# Patient Record
Sex: Female | Born: 1995 | Race: White | Hispanic: No | Marital: Single | Smoking: Former smoker
Health system: Southern US, Community
[De-identification: ages and names within clinical notes are randomized; demographics above are authoritative.]

## PROBLEM LIST (undated history)

## (undated) HISTORY — PX: NOSE SURGERY: SHX723

---

## 2016-01-11 ENCOUNTER — Emergency Department (HOSPITAL_BASED_OUTPATIENT_CLINIC_OR_DEPARTMENT_OTHER): Payer: Medicaid Other

## 2016-01-11 ENCOUNTER — Encounter (HOSPITAL_BASED_OUTPATIENT_CLINIC_OR_DEPARTMENT_OTHER): Payer: Self-pay | Admitting: *Deleted

## 2016-01-11 ENCOUNTER — Emergency Department (HOSPITAL_BASED_OUTPATIENT_CLINIC_OR_DEPARTMENT_OTHER)
Admission: EM | Admit: 2016-01-11 | Discharge: 2016-01-11 | Disposition: A | Discharge status: Home / Self Care | Payer: Medicaid Other | Attending: Physician Assistant | Admitting: Physician Assistant

## 2016-01-11 DIAGNOSIS — I951 Orthostatic hypotension: Secondary | ICD-10-CM | POA: Insufficient documentation

## 2016-01-11 DIAGNOSIS — R42 Dizziness and giddiness: Secondary | ICD-10-CM | POA: Diagnosis not present

## 2016-01-11 DIAGNOSIS — N390 Urinary tract infection, site not specified: Secondary | ICD-10-CM

## 2016-01-11 DIAGNOSIS — Z3A11 11 weeks gestation of pregnancy: Secondary | ICD-10-CM | POA: Insufficient documentation

## 2016-01-11 DIAGNOSIS — Z3490 Encounter for supervision of normal pregnancy, unspecified, unspecified trimester: Secondary | ICD-10-CM

## 2016-01-11 DIAGNOSIS — O99411 Diseases of the circulatory system complicating pregnancy, first trimester: Secondary | ICD-10-CM | POA: Insufficient documentation

## 2016-01-11 DIAGNOSIS — R55 Syncope and collapse: Secondary | ICD-10-CM

## 2016-01-11 DIAGNOSIS — O2341 Unspecified infection of urinary tract in pregnancy, first trimester: Secondary | ICD-10-CM | POA: Insufficient documentation

## 2016-01-11 DIAGNOSIS — O9989 Other specified diseases and conditions complicating pregnancy, childbirth and the puerperium: Secondary | ICD-10-CM | POA: Diagnosis present

## 2016-01-11 LAB — COMPREHENSIVE METABOLIC PANEL
ALBUMIN: 3.4 g/dL — AB (ref 3.5–5.0)
ALK PHOS: 55 U/L (ref 38–126)
ALT: 13 U/L — AB (ref 14–54)
AST: 13 U/L — AB (ref 15–41)
Anion gap: 6 (ref 5–15)
BILIRUBIN TOTAL: 0.3 mg/dL (ref 0.3–1.2)
BUN: 8 mg/dL (ref 6–20)
CALCIUM: 8.9 mg/dL (ref 8.9–10.3)
CO2: 24 mmol/L (ref 22–32)
Chloride: 105 mmol/L (ref 101–111)
Creatinine, Ser: 0.43 mg/dL — ABNORMAL LOW (ref 0.44–1.00)
GFR calc Af Amer: >60 mL/min (ref >60)
GFR calc non Af Amer: >60 mL/min (ref >60)
GLUCOSE: 71 mg/dL (ref 65–99)
Potassium: 3.7 mmol/L (ref 3.5–5.1)
Sodium: 135 mmol/L (ref 135–145)
TOTAL PROTEIN: 6.6 g/dL (ref 6.5–8.1)

## 2016-01-11 LAB — URINALYSIS, ROUTINE W REFLEX MICROSCOPIC
BILIRUBIN URINE: NEGATIVE
Glucose, UA: NEGATIVE mg/dL
KETONES UR: NEGATIVE mg/dL
Leukocytes, UA: NEGATIVE
Nitrite: NEGATIVE
PH: 6.5 (ref 5.0–8.0)
Protein, ur: NEGATIVE mg/dL
SPECIFIC GRAVITY, URINE: 1.023 (ref 1.005–1.030)

## 2016-01-11 LAB — CBC WITH DIFFERENTIAL/PLATELET
BASOS ABS: 0 10*3/uL (ref 0.0–0.1)
BASOS PCT: 0 %
Eosinophils Absolute: 0.1 10*3/uL (ref 0.0–0.7)
Eosinophils Relative: 1 %
HEMATOCRIT: 32.5 % — AB (ref 36.0–46.0)
HEMOGLOBIN: 11.2 g/dL — AB (ref 12.0–15.0)
Lymphocytes Relative: 23 %
Lymphs Abs: 1.7 10*3/uL (ref 0.7–4.0)
MCH: 30.4 pg (ref 26.0–34.0)
MCHC: 34.5 g/dL (ref 30.0–36.0)
MCV: 88.3 fL (ref 78.0–100.0)
Monocytes Absolute: 0.4 10*3/uL (ref 0.1–1.0)
Monocytes Relative: 6 %
NEUTROS ABS: 5.1 10*3/uL (ref 1.7–7.7)
NEUTROS PCT: 70 %
Platelets: 180 10*3/uL (ref 150–400)
RBC: 3.68 MIL/uL — ABNORMAL LOW (ref 3.87–5.11)
RDW: 11.9 % (ref 11.5–15.5)
WBC: 7.3 10*3/uL (ref 4.0–10.5)

## 2016-01-11 LAB — HCG, QUANTITATIVE, PREGNANCY: hCG, Beta Chain, Quant, S: 48861 m[IU]/mL — ABNORMAL HIGH (ref <5)

## 2016-01-11 LAB — URINE MICROSCOPIC-ADD ON

## 2016-01-11 LAB — CBG MONITORING, ED
GLUCOSE-CAPILLARY: 86 mg/dL (ref 65–99)
Glucose-Capillary: 82 mg/dL (ref 65–99)

## 2016-01-11 MED ORDER — CEFTRIAXONE SODIUM 1 G IJ SOLR
1.0000 g | Freq: Once | INTRAMUSCULAR | Status: AC
  Administered 2016-01-11: 1 g via INTRAVENOUS
  Filled 2016-01-11: qty 10

## 2016-01-11 MED ORDER — CEPHALEXIN 500 MG PO CAPS: ORAL_CAPSULE | ORAL | Status: DC

## 2016-01-11 MED ORDER — SODIUM CHLORIDE 0.9 % IV BOLUS (SEPSIS)
1000.0000 mL | Freq: Once | INTRAVENOUS | Status: AC
  Administered 2016-01-11: 1000 mL via INTRAVENOUS

## 2016-01-11 NOTE — ED Provider Notes (Signed)
CSN: [id number]     Arrival date & time 01/11/16  1327 History   First MD Initiated Contact with Patient 01/11/16 1420     Chief Complaint  Patient presents with  . Dizziness     (Consider location/radiation/quality/duration/timing/severity/associated sxs/prior Treatment) HPI   20 year old female presents for evaluation of syncope. Patient states that she has a history of syncope especially worsening during pregnancy. She is currently 3 1/2 month pregnant. 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 She does report heart palpitation during this episode. She denies any complaint at this time including no severe headache, vision changes, neck pain, chest pain, shortness of breath, abdominal pain or back pain or pain to her extremities. She denies any injury. She was told that she was diagnosed with having urinary tract infection a week ago after having 2 weeks of urinary frequency and urgency and burning urination. She was prescribed antibiotic but never had it filled. States her symptoms mostly resolved. She mentioned having an STD screen and was negative. Denies any family history of premature cardiac death. She has been eating and drinking as usual. Reportedly had a formal ultrasound several weeks prior and states that it was normal. Patient however requesting for an additional ultrasound to assess her fetus other she denies having any increasing abdominal pain or abdominal injury. No other complaint.  History reviewed. No pertinent past medical history. Past Surgical History  Procedure Laterality Date  . Nose surgery     No family history on file. Social History  Substance Use Topics  . Smoking status: Never Smoker   . Smokeless tobacco: None  . Alcohol Use: No   OB History    Gravida Para Term Preterm  AB TAB SAB Ectopic Multiple Living   1              Review of Systems  All other systems reviewed and are negative.     Allergies  Review of patient's allergies indicates no known allergies.  Home Medications   Prior to Admission medications   Not on File   BP 117/71 mmHg  Pulse 94  Temp(Src) 98.8 F (37.1 C) (Oral)  Resp 20  Ht 5\' 10"  (1.778 m)  Wt 98.431 kg  BMI 31.14 kg/m2  SpO2 100% Physical Exam  Constitutional: She is oriented to person, place, and time. She appears well-developed and well-nourished. No distress.  Caucasian female caring for her baby in the room in no acute discomfort, nontoxic  HENT:  Head: Normocephalic and atraumatic.  Eyes: Conjunctivae are normal. Pupils are equal, round, and reactive to light.  Neck: Normal range of motion. Neck supple.  Cardiovascular: Normal rate and regular rhythm.   Pulmonary/Chest: Effort normal and breath sounds normal.  Abdominal: Soft. Bowel sounds are normal. She exhibits no distension. There is no tenderness.  Lymphadenopathy:    She has no cervical adenopathy.  Neurological: She is alert and oriented to person, place, and time.  Skin: No rash noted.  Psychiatric: She has a normal mood and affect.  Nursing note and vitals reviewed.   ED Course  Procedures (including critical care time) Labs Review Labs Reviewed  URINALYSIS, ROUTINE W REFLEX MICROSCOPIC (NOT AT [facility]) - Abnormal; Notable for the following:    APPearance CLOUDY (*)    Hgb urine dipstick SMALL (*)    All  other components within normal limits  URINE MICROSCOPIC-ADD ON - Abnormal; Notable for the following:    Squamous Epithelial / LPF 6-30 (*)    Bacteria, UA MANY (*)    All other components within normal limits  CBG MONITORING, ED    Imaging Review No results found. I have personally reviewed and evaluated these images and lab results as part of my medical decision-making.   EKG Interpretation None      Date: 01/11/2016  Rate: 64   Rhythm: normal sinus rhythm  QRS Axis: normal  Intervals: normal  ST/T Wave abnormalities: normal  Conduction Disutrbances: none  Narrative Interpretation:   Old EKG Reviewed: No significant changes noted     MDM   Final diagnoses:  Near syncope  Orthostatic hypotension  Pregnancy  UTI (lower urinary tract infection)    BP 106/56 mmHg  Pulse 64  Temp(Src) 98.8 F (37.1 C) (Oral)  Resp 21  Ht  (1.778 m)  Wt 98.431 kg  BMI 31.14 kg/m2  SpO2 100%   2:49 PM Pregnant female with multiple episodes of near syncope. She denies any true syncopal episode and denies any specific injury. She had an untreated UTI in which she was evaluated last week but did not take any antibiotic. Her urine today shows many bacteria as well as a small amount of hemoglobin on urine dipstick. Plan to treat for a symptomatic UTI in a pregnant female. Patient mentioned she has had an STD screen weeks ago and declined for this disease screening.  3:17 PM i performed a bedside US assessing her pregnancy.  Evidence of IUP, fetus with movement and heart rate detected. No abd pain.  Pt reassured.  Recommend close f/u with OBGYN for additional more comprehensive imaging if indicated.    Positive orthostatic vital sign, and pt became symptomatic.  Her near syncope is likely 2/2 hypovolemia during pregnancy.  Doubt PE.  Did report a transient headache during her near syncope but that quickly abates.  No focal neuro deficit on exam.  Doubt venous sinus thrombosis.  Care discussed with Dr. Corlis Leak  EMERGENCY DEPARTMENT [place] PREGNANCY "Study: Limited Ultrasound of the Pelvis"  INDICATIONS:Pregnancy(required) Multiple views of the uterus and pelvic cavity are obtained with a multi-frequency probe.  APPROACH:Transabdominal   PERFORMED BY: Myself  IMAGES ARCHIVED?: Yes  LIMITATIONS: Emergent procedure  PREGNANCY FREE FLUID: None  PREGNANCY UTERUS FINDINGS:Uterus normal size ADNEXAL FINDINGS:Left ovary  not seen and Right ovary not seen  PREGNANCY FINDINGS: Intrauterine gestational sac noted, Yolk sac noted, Fetal pole present and Fetal heart activity seen  INTERPRETATION: Viable intrauterine pregnancy  GESTATIONAL AGE, ESTIMATE: 11 weeks  FETAL HEART RATE: not measured, but present  COMMENT(Estimate of Gestational Age):  Approximately 11 weeks by LMP.     4:08 PM EKG without acute ischemic changes or abnormal arrhythmia. No evidence of anemia. CBGs normal at 86.  Positive orthostasis.  Pt receiving IVF.  Orthostatic Vital Signs KM   Orthostatic Lying  - BP- Lying: 103/50 mmHg ; Pulse- Lying: 64  Orthostatic Sitting - BP- Sitting: 108/58 mmHg ; Pulse- Sitting: 64  Orthostatic Standing at 0 minutes - BP- Standing at 0 minutes: 112/65 mmHg ; Pulse- Standing at 0 minutes: 91      4:45 PM Pt felt better with IVF.  She has received rocephin IV as treatment for UTI.  Discharge with Keflex.  Pt to f/u with OBGYN for further care.  Return precaution discussed.   Fayrene Helper, PA-C 01/11/16 1645  Courteney  Randall AnLyn Mackuen, MD 01/13/16 2240

## 2016-01-11 NOTE — ED Notes (Addendum)
Passed out at work today. She is [redacted] weeks pregnant. She feels dizzy and nauseated on arrival. States she was diagnosed with a UTI last week but never got her Rx for antibiotics.

## 2016-01-11 NOTE — ED Notes (Signed)
Patient states she was dx with a UTI about a week ago, was prescribed an antibiotic but she has not yet picked it up from the pharmacy, reasoning that she has not gone to [place] to get it. Patient states she is here today because she was getting dizzy at work and passed out. 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 Patient states that she had two episodes of syncope today while working. Patient denies hitting her head, states the first time she "passed out into a booth" and the second time she was walking and "fell into the floor". Patient denies any injuries from the syncopal episodes. Patient states her only pain is abdominal cramping, "but it isn't anything to worry about". Patient support person and child at bedside.

## 2016-01-11 NOTE — ED Notes (Signed)
US cancelled per [place], [place]

## 2016-01-11 NOTE — Discharge Instructions (Signed)
You have been evaluated for your near passing out episodes.  It is likely due to lack of fluid in your body due to your pregnancy.  Please stay hydrated.  You will also need to take antibiotic as prescribed for the full duration as treatment for urinary tract infection.  Follow up with your OBGYN for close monitor and further care.  Return if you have any concerns.   Near-Syncope Near-syncope (commonly known as near fainting) is sudden weakness, dizziness, or feeling like you might pass out. This can happen when getting up or while standing for a long time. It is caused by a sudden decrease in blood flow to the brain, which can occur for various reasons. Most of the reasons are not serious.  HOME CARE Watch your condition for any changes.  Have someone stay with you until you feel stable.  If you feel like you are going to pass out:  Lie down right away.  Prop your feet up if you can.  Breathe deeply and steadily.  Move only when the feeling has gone away. Most of the time, this feeling lasts only a few minutes. You may feel tired for several hours.  Drink enough fluids to keep your pee (urine) clear or pale yellow.  If you are taking blood pressure or heart medicine, stand up slowly.  Follow up with your doctor as told. GET HELP RIGHT AWAY IF:   You have a severe headache.  You have unusual pain in the chest, belly (abdomen), or back.  You have bleeding from the mouth or butt (rectum), or you have black or tarry poop (stool).  You feel your heart beat differently than normal, or you have a very fast pulse.  You pass out, or you twitch and shake when you pass out.  You pass out when sitting or lying down.  You feel confused.  You have trouble walking.  You are weak.  You have vision problems. MAKE SURE YOU:   Understand these instructions.  Will watch your condition.  Will get help right away if you are not doing well or get worse.   This information is not  intended to replace advice given to you by your health care provider. Make sure you discuss any questions you have with your health care provider.   Document Released: 12/03/2007 Document Revised: 07/07/2014 Document Reviewed: 11/19/2012 Elsevier Interactive Patient Education [employer]2016 Elsevier Inc.

## 2016-01-11 NOTE — ED Notes (Signed)
PA at bedside with bedside [place]

## 2016-01-11 NOTE — ED Notes (Signed)
Patient ambulatory to restroom, given soup and crackers when she returned to the room. Patient is aware we are waiting for IV fluids to complete before D/C.

## 2020-01-13 ENCOUNTER — Other Ambulatory Visit: Payer: Self-pay

## 2020-01-13 ENCOUNTER — Encounter (HOSPITAL_BASED_OUTPATIENT_CLINIC_OR_DEPARTMENT_OTHER): Payer: Self-pay | Admitting: *Deleted

## 2020-01-13 ENCOUNTER — Emergency Department (HOSPITAL_BASED_OUTPATIENT_CLINIC_OR_DEPARTMENT_OTHER)
Admission: EM | Admit: 2020-01-13 | Discharge: 2020-01-14 | Disposition: A | Discharge status: Home / Self Care | Payer: Medicaid Other | Attending: Emergency Medicine | Admitting: Emergency Medicine

## 2020-01-13 DIAGNOSIS — O26812 Pregnancy related exhaustion and fatigue, second trimester: Secondary | ICD-10-CM | POA: Insufficient documentation

## 2020-01-13 DIAGNOSIS — Z3A19 19 weeks gestation of pregnancy: Secondary | ICD-10-CM | POA: Diagnosis not present

## 2020-01-13 DIAGNOSIS — Z20822 Contact with and (suspected) exposure to covid-19: Secondary | ICD-10-CM | POA: Diagnosis not present

## 2020-01-13 DIAGNOSIS — R519 Headache, unspecified: Secondary | ICD-10-CM | POA: Diagnosis not present

## 2020-01-13 LAB — CBC WITH DIFFERENTIAL/PLATELET
Abs Immature Granulocytes: 0.07 10*3/uL (ref 0.00–0.07)
Basophils Absolute: 0 10*3/uL (ref 0.0–0.1)
Basophils Relative: 0 %
Eosinophils Absolute: 0.3 10*3/uL (ref 0.0–0.5)
Eosinophils Relative: 2 %
HCT: 35.3 % — ABNORMAL LOW (ref 36.0–46.0)
Hemoglobin: 12 g/dL (ref 12.0–15.0)
Immature Granulocytes: 1 %
Lymphocytes Relative: 19 %
Lymphs Abs: 2.3 10*3/uL (ref 0.7–4.0)
MCH: 30.2 pg (ref 26.0–34.0)
MCHC: 34 g/dL (ref 30.0–36.0)
MCV: 88.7 fL (ref 80.0–100.0)
Monocytes Absolute: 0.4 10*3/uL (ref 0.1–1.0)
Monocytes Relative: 4 %
Neutro Abs: 9.1 10*3/uL — ABNORMAL HIGH (ref 1.7–7.7)
Neutrophils Relative %: 74 %
Platelets: 192 10*3/uL (ref 150–400)
RBC: 3.98 MIL/uL (ref 3.87–5.11)
RDW: 12.6 % (ref 11.5–15.5)
WBC: 12.1 10*3/uL — ABNORMAL HIGH (ref 4.0–10.5)
nRBC: 0 % (ref 0.0–0.2)

## 2020-01-13 LAB — COMPREHENSIVE METABOLIC PANEL
ALT: 22 U/L (ref 0–44)
AST: 17 U/L (ref 15–41)
Albumin: 3.1 g/dL — ABNORMAL LOW (ref 3.5–5.0)
Alkaline Phosphatase: 69 U/L (ref 38–126)
Anion gap: 11 (ref 5–15)
BUN: 11 mg/dL (ref 6–20)
CO2: 23 mmol/L (ref 22–32)
Calcium: 8.7 mg/dL — ABNORMAL LOW (ref 8.9–10.3)
Chloride: 102 mmol/L (ref 98–111)
Creatinine, Ser: 0.53 mg/dL (ref 0.44–1.00)
GFR calc Af Amer: >60 mL/min (ref >60)
GFR calc non Af Amer: >60 mL/min (ref >60)
Glucose, Bld: 96 mg/dL (ref 70–99)
Potassium: 4.2 mmol/L (ref 3.5–5.1)
Sodium: 136 mmol/L (ref 135–145)
Total Bilirubin: 0.2 mg/dL — ABNORMAL LOW (ref 0.3–1.2)
Total Protein: 6.9 g/dL (ref 6.5–8.1)

## 2020-01-13 LAB — URINALYSIS, ROUTINE W REFLEX MICROSCOPIC
Bilirubin Urine: NEGATIVE
Glucose, UA: NEGATIVE mg/dL
Ketones, ur: NEGATIVE mg/dL
Leukocytes,Ua: NEGATIVE
Nitrite: NEGATIVE
Protein, ur: NEGATIVE mg/dL
Specific Gravity, Urine: 1.025 (ref 1.005–1.030)
pH: 6.5 (ref 5.0–8.0)

## 2020-01-13 LAB — URINALYSIS, MICROSCOPIC (REFLEX)

## 2020-01-13 LAB — SARS CORONAVIRUS 2 BY RT PCR (HOSPITAL ORDER, PERFORMED IN ~~LOC~~ HOSPITAL LAB): SARS Coronavirus 2: NEGATIVE

## 2020-01-13 NOTE — ED Triage Notes (Addendum)
Pt c/ generalized fatigue , fever , h/a  X 2 weeks pt is 19 weeks preg

## 2020-01-13 NOTE — ED Provider Notes (Signed)
[facility] Provider Note: Katherine Dell, MD, FACEP  CSN: [id number] MRN: [id number] ARRIVAL: 01/13/20 at 1907 ROOM: MH11/[facility]11   CHIEF COMPLAINT  Fatigue   HISTORY OF PRESENT ILLNESS  01/13/20 11:53 PM Katherine Wall is a 24 y.o. female who is [redacted] weeks pregnant.  She is here with generalized fatigue and somnolence for the past 2 weeks.  She states "I do not feel like myself".  She is having difficulty concentrating.  She is also been having daily headaches "Which are not really headaches. You know what it is like when you are in a movie and something is about to happen and the sound goes off?  Well, it is like that.  I can't really explain it.".  These have been occurring multiple times throughout the day, on a daily basis, and last about 10 minutes each.  They are sometimes associated with scotomata.    She states she was placed on an acid reflex drug that starts with "[place]-" (Reglan/metoclopramide per prenatal visit on 11/24/2019) which has not helped her headaches.  Her prenatal visit on 12/22/2019 lists her medications as Zoloft 50 mg daily, Fioricet every 6 hours as needed for headache and Zofran 4 mg every 8 hours as needed for nausea).  She has also been told that her magnesium is low but she has not filled her prescription for magnesium supplements (she thinks this may have been canceled due to insurance reasons).   History reviewed. No pertinent past medical history.  Past Surgical History:  Procedure Laterality Date  . NOSE SURGERY      No family history on file.  Social History   Tobacco Use  . Smoking status: Never Smoker  Substance Use Topics  . Alcohol use: No  . Drug use: No    Prior to Admission medications   Not on File    Allergies Patient has no known allergies.   REVIEW OF SYSTEMS  Negative except as noted here or in the History of Present Illness.   PHYSICAL EXAMINATION  Initial Vital Signs Blood pressure 118/64, pulse 85,  temperature 98 F (36.7 C), resp. rate 16, height 5\' 9"  (1.753 m), weight 104.3 kg, SpO2 98 %.  Examination General: Well-developed, well-nourished female in no acute distress; appearance consistent with age of record HENT: normocephalic; atraumatic Eyes: pupils equal, round and reactive to light; extraocular muscles intact Neck: supple Heart: regular rate and rhythm Lungs: clear to auscultation bilaterally Abdomen: soft; nondistended; nontender; bowel sounds present Extremities: No deformity; full range of motion; pulses normal Neurologic: Awake, alert and oriented; motor function intact in all extremities and symmetric; no facial droop Skin: Warm and dry Psychiatric: Normal mood and affect   RESULTS  Summary of this visit's results, reviewed and interpreted by myself:   EKG Interpretation  Date/Time:    Ventricular Rate:    PR Interval:    QRS Duration:   QT Interval:    QTC Calculation:   R Axis:     Text Interpretation:        Laboratory Studies: Results for orders placed or performed during the hospital encounter of 01/13/20 (from the past 24 hour(s))  SARS Coronavirus 2 by RT PCR (hospital order, performed in [facility] hospital lab) Nasopharyngeal Nasopharyngeal Swab     Status: None   Collection Time: 01/13/20  7:39 PM   Specimen: Nasopharyngeal Swab  Result Value Ref Range   SARS Coronavirus 2 NEGATIVE NEGATIVE  CBC with Differential     Status: Abnormal  Collection Time: 01/13/20  7:39 PM  Result Value Ref Range   WBC 12.1 (H) 4.0 - 10.5 K/uL   RBC 3.98 3.87 - 5.11 MIL/uL   Hemoglobin 12.0 12.0 - 15.0 g/dL   HCT 12.4 (L) 36 - 46 %   MCV 88.7 80.0 - 100.0 fL   MCH 30.2 26.0 - 34.0 pg   MCHC 34.0 30.0 - 36.0 g/dL   RDW 58.0 99.8 - 33.8 %   Platelets 192 150 - 400 K/uL   nRBC 0.0 0.0 - 0.2 %   Neutrophils Relative % 74 %   Neutro Abs 9.1 (H) 1.7 - 7.7 K/uL   Lymphocytes Relative 19 %   Lymphs Abs 2.3 0.7 - 4.0 K/uL   Monocytes Relative 4 %    Monocytes Absolute 0.4 0 - 1 K/uL   Eosinophils Relative 2 %   Eosinophils Absolute 0.3 0 - 0 K/uL   Basophils Relative 0 %   Basophils Absolute 0.0 0 - 0 K/uL   Smear Review HIDE    Immature Granulocytes 1 %   Abs Immature Granulocytes 0.07 0.00 - 0.07 K/uL  Comprehensive metabolic panel     Status: Abnormal   Collection Time: 01/13/20  7:39 PM  Result Value Ref Range   Sodium 136 135 - 145 mmol/L   Potassium 4.2 3.5 - 5.1 mmol/L   Chloride 102 98 - 111 mmol/L   CO2 23 22 - 32 mmol/L   Glucose, Bld 96 70 - 99 mg/dL   BUN 11 6 - 20 mg/dL   Creatinine, Ser 2.50 0.44 - 1.00 mg/dL   Calcium 8.7 (L) 8.9 - 10.3 mg/dL   Total Protein 6.9 6.5 - 8.1 g/dL   Albumin 3.1 (L) 3.5 - 5.0 g/dL   AST 17 15 - 41 U/L   ALT 22 0 - 44 U/L   Alkaline Phosphatase 69 38 - 126 U/L   Total Bilirubin 0.2 (L) 0.3 - 1.2 mg/dL   GFR calc non Af Amer >60 >60 mL/min   GFR calc Af Amer >60 >60 mL/min   Anion gap 11 5 - 15  Urinalysis, Routine w reflex microscopic     Status: Abnormal   Collection Time: 01/13/20  7:43 PM  Result Value Ref Range   Color, Urine YELLOW YELLOW   APPearance CLOUDY (A) CLEAR   Specific Gravity, Urine 1.025 1.005 - 1.030   pH 6.5 5.0 - 8.0   Glucose, UA NEGATIVE NEGATIVE mg/dL   Hgb urine dipstick SMALL (A) NEGATIVE   Bilirubin Urine NEGATIVE NEGATIVE   Ketones, ur NEGATIVE NEGATIVE mg/dL   Protein, ur NEGATIVE NEGATIVE mg/dL   Nitrite NEGATIVE NEGATIVE   Leukocytes,Ua NEGATIVE NEGATIVE  Urinalysis, Microscopic (reflex)     Status: Abnormal   Collection Time: 01/13/20  7:43 PM  Result Value Ref Range   RBC / HPF 6-10 0 - 5 RBC/hpf   WBC, UA 0-5 0 - 5 WBC/hpf   Bacteria, UA MANY (A) NONE SEEN   Squamous Epithelial / LPF 11-20 0 - 5   Imaging Studies: No results found.  ED COURSE and MDM  Nursing notes, initial and subsequent vitals signs, including pulse oximetry, reviewed and interpreted by myself.  Vitals:   01/13/20 1928 01/13/20 1930 01/13/20 2245  BP:  119/80  118/64  Pulse:  91 85  Resp:  18 16  Temp:  98 F (36.7 C)   SpO2:  100% 98%  Weight: 104.3 kg    Height: 5\' 9"  (1.753 m)  Medications - No data to display  The patient was advised of reassuring lab work.  We will test the TSH but she was advised we will not get that result tonight.  I do not see any indication for acute intervention at this time.  She was advised to follow-up with her OB/GYN regarding her TSH results.   PROCEDURES  Procedures   ED DIAGNOSES     ICD-10-CM   1. Fatigue during pregnancy in second trimester  O26.812   2. Daily headache  R51.9        Alayiah Fontes, Jonny Ruiz, MD 01/14/20 [phone]

## 2020-01-14 LAB — TSH: TSH: 0.879 u[IU]/mL (ref 0.350–4.500)

## 2020-12-31 ENCOUNTER — Emergency Department (HOSPITAL_BASED_OUTPATIENT_CLINIC_OR_DEPARTMENT_OTHER)
Admission: EM | Admit: 2020-12-31 | Discharge: 2020-12-31 | Disposition: A | Discharge status: Home / Self Care | Payer: Medicaid Other | Attending: Emergency Medicine | Admitting: Emergency Medicine

## 2020-12-31 ENCOUNTER — Encounter (HOSPITAL_BASED_OUTPATIENT_CLINIC_OR_DEPARTMENT_OTHER): Payer: Self-pay | Admitting: *Deleted

## 2020-12-31 ENCOUNTER — Other Ambulatory Visit: Payer: Self-pay

## 2020-12-31 DIAGNOSIS — F17210 Nicotine dependence, cigarettes, uncomplicated: Secondary | ICD-10-CM | POA: Insufficient documentation

## 2020-12-31 DIAGNOSIS — L03115 Cellulitis of right lower limb: Secondary | ICD-10-CM | POA: Insufficient documentation

## 2020-12-31 DIAGNOSIS — R2241 Localized swelling, mass and lump, right lower limb: Secondary | ICD-10-CM | POA: Diagnosis present

## 2020-12-31 MED ORDER — DOXYCYCLINE HYCLATE 100 MG PO CAPS: 100.0000 mg | ORAL_CAPSULE | Freq: Two times a day (BID) | ORAL | 0 refills | Status: AC

## 2020-12-31 MED ORDER — DOXYCYCLINE HYCLATE 100 MG PO TABS
100.0000 mg | ORAL_TABLET | Freq: Once | ORAL | Status: AC
  Administered 2020-12-31: 100 mg via ORAL
  Filled 2020-12-31: qty 1

## 2020-12-31 NOTE — ED Provider Notes (Signed)
[facility] EMERGENCY DEPARTMENT Provider Note   CSN: [id number] Arrival date & time: 12/31/20  1523     History Chief Complaint  Patient presents with   Insect Bite    Katherine Wall is a 25 y.o. female presents with concern for pain and spreading redness around insect bite in her right medial thigh that happened last night.  Denies any fevers or chills, nausea, vomiting.  Is extremely anxious, has history of anxiety.  Her 3 children present at the bedside including a young infant, she states she is not breast-feeding.  She denies any numbness, tingling, or weakness in her legs.  I personally reviewed her medical records.  She is on Zoloft, but otherwise carries no medical diagnoses and is not on any medications every day.  HPI     No past medical history on file.  There are no problems to display for this patient.   Past Surgical History:  Procedure Laterality Date   NOSE SURGERY       OB History     Gravida  1   Para      Term      Preterm      AB      Living         SAB      IAB      Ectopic      Multiple      Live Births              No family history on file.  Social History   Tobacco Use   Smoking status: Every Day    Pack years: 0.00    Types: Cigarettes   Smokeless tobacco: Never  Substance Use Topics   Alcohol use: No   Drug use: No    Home Medications Prior to Admission medications   Medication Sig Start Date End Date Taking? Authorizing Provider  cetirizine (ZYRTEC) 10 MG tablet Take by mouth. 09/22/20  Yes [provider]  doxycycline (VIBRAMYCIN) 100 MG capsule Take 1 capsule (100 mg total) by mouth 2 (two) times daily for 7 days. 12/31/20 01/07/21 Yes Aslee Such R, PA-C  hydrOXYzine (VISTARIL) 50 MG capsule Take by mouth. 03/13/20  Yes [provider]  sertraline (ZOLOFT) 50 MG tablet Take a half tablet daily for 1 week then 1 tablet daily 12/22/19  Yes [provider]  PROAIR  HFA 108 (90 Base) MCG/ACT inhaler Inhale into the lungs. 09/22/20   [provider]    Allergies    Patient has no known allergies.  Review of Systems   Review of Systems  Constitutional: Negative.   HENT: Negative.    Respiratory: Negative.    Cardiovascular: Negative.   Gastrointestinal: Negative.   Musculoskeletal: Negative.   Skin:  Positive for wound.  Allergic/Immunologic: Negative for immunocompromised state.  Neurological: Negative.    Physical Exam Updated Vital Signs BP 107/66 (BP Location: Left Arm)   Pulse 91   Temp 98.3 F (36.8 C) (Oral)   Resp 20   Ht 5\' 10"  (1.778 m)   Wt 113.4 kg   LMP  (LMP Unknown)   SpO2 95%   BMI 35.87 kg/m   Physical Exam Vitals and nursing note reviewed.  HENT:     Head: Normocephalic and atraumatic.     Mouth/Throat:     Mouth: Mucous membranes are moist.     Pharynx: No oropharyngeal exudate or posterior oropharyngeal erythema.  Eyes:     General:  Right eye: No discharge.        Left eye: No discharge.     Conjunctiva/sclera: Conjunctivae normal.  Cardiovascular:     Rate and Rhythm: Normal rate and regular rhythm.     Pulses: Normal pulses.          Dorsalis pedis pulses are 2+ on the right side and 2+ on the left side.     Heart sounds: Normal heart sounds. No murmur heard. Pulmonary:     Effort: Pulmonary effort is normal. No respiratory distress.     Breath sounds: Normal breath sounds. No wheezing or rales.  Abdominal:     General: Bowel sounds are normal. There is no distension.     Palpations: Abdomen is soft.     Tenderness: There is no abdominal tenderness.  Musculoskeletal:        General: No deformity.     Cervical back: Neck supple.     Right lower leg: No edema.     Left lower leg: No edema.  Skin:    General: Skin is warm and dry.     Capillary Refill: Capillary refill takes less than 2 seconds.     Findings: Wound present.       Neurological:     General: No focal deficit  present.     Mental Status: She is alert and oriented to person, place, and time. Mental status is at baseline.  Psychiatric:        Mood and Affect: Mood normal.    ED Results / Procedures / Treatments   Labs (all labs ordered are listed, but only abnormal results are displayed) Labs Reviewed - No data to display  EKG None  Radiology No results found.  Procedures Procedures   Medications Ordered in ED Medications  doxycycline (VIBRA-TABS) tablet 100 mg (has no administration in time range)    ED Course  I have reviewed the triage vital signs and the nursing notes.  Pertinent labs & imaging results that were available during my care of the patient were reviewed by me and considered in my medical decision making (see chart for details).    MDM Rules/Calculators/A&P                         25 year old female presents with concern for insect bite to the right medial thigh with associated pain.  Differential diagnose includes and limited to acute allergic reaction, cellulitis, abscess, erysipelas, folliculitis.  Vital signs are normal intake.  Cardiopulmonary exam is normal, abdominal exam is benign.  Skin exam did reveal a 3 x 3 cm area of erythema, induration, and central scab.  No crepitus, fluctuation, or draining sinus tract with area.  Physical exam and vital signs are reassuring.  Exam and HPI most consistent with insect bite with surrounding cellulitis without abscess.  Will minister first dose of antibiotics in the emergency department and discharged with antibiotics at home.  No history of MRSA infection. Patient is not breast-feeding.  Katherine Wall voiced understanding of her medical evaluation and treatment plan.  Each of her questions was answered to her expressed satisfaction.  Strict return precautions given.  Patient is well-appearing, stable, and appropriate for discharge.  This chart was dictated using voice recognition software, Dragon. Despite the best  efforts of this provider to proofread and correct errors, errors may still occur which can change documentation meaning.  Final Clinical Impression(s) / ED Diagnoses Final diagnoses:  Cellulitis of right lower extremity  Rx / DC Orders ED Discharge Orders          Ordered    doxycycline (VIBRAMYCIN) 100 MG capsule  2 times daily        12/31/20 1618             Chandria Rookstool, Eugene Gavia, PA-C 12/31/20 1627    Virgina Norfolk, DO 12/31/20 1636

## 2020-12-31 NOTE — ED Triage Notes (Signed)
Pt reports insect bite to her right inner thigh last night.

## 2020-12-31 NOTE — Discharge Instructions (Addendum)
You were seen in the ER today for the redness and pain around the insect bite on your leg.  There are some signs of infection called cellulitis, there is no sign of abscess which is a pocket of pus.  You were administered your first dose of antibiotics in the emergency department and have been prescribed antibiotic to take at home for the next week.  Please take them as prescribed for the entire course.  Please be aware that this antibiotic may make you more sensitive to the sun so please wear sunblock or cover your exposed skin when you are outside for the duration of your antibiotic course.  Return to ER if develop any fevers > 100.4 F after 24 hours of antibiotics, nausea or vomiting does that not stop, significant worsening of the pain or swelling in your inner thigh, or any other new severe symptoms.

## 2021-08-30 ENCOUNTER — Emergency Department (HOSPITAL_BASED_OUTPATIENT_CLINIC_OR_DEPARTMENT_OTHER)
Admission: EM | Admit: 2021-08-30 | Discharge: 2021-08-30 | Discharge status: Against Medical Advice | Payer: Medicaid Other | Attending: Emergency Medicine | Admitting: Emergency Medicine

## 2021-08-30 ENCOUNTER — Encounter (HOSPITAL_BASED_OUTPATIENT_CLINIC_OR_DEPARTMENT_OTHER): Payer: Self-pay | Admitting: *Deleted

## 2021-08-30 ENCOUNTER — Other Ambulatory Visit: Payer: Self-pay

## 2021-08-30 DIAGNOSIS — O26891 Other specified pregnancy related conditions, first trimester: Secondary | ICD-10-CM | POA: Diagnosis present

## 2021-08-30 DIAGNOSIS — Z5321 Procedure and treatment not carried out due to patient leaving prior to being seen by health care provider: Secondary | ICD-10-CM | POA: Diagnosis not present

## 2021-08-30 DIAGNOSIS — O99511 Diseases of the respiratory system complicating pregnancy, first trimester: Secondary | ICD-10-CM | POA: Diagnosis not present

## 2021-08-30 DIAGNOSIS — J029 Acute pharyngitis, unspecified: Secondary | ICD-10-CM | POA: Diagnosis not present

## 2021-08-30 DIAGNOSIS — Z3A12 12 weeks gestation of pregnancy: Secondary | ICD-10-CM | POA: Insufficient documentation

## 2021-08-30 LAB — GROUP A STREP BY PCR: Group A Strep by PCR: NOT DETECTED

## 2021-08-30 NOTE — ED Triage Notes (Addendum)
Sore throat and strep exposure. Pt is [redacted] weeks pregnant. ?

## 2021-11-22 ENCOUNTER — Emergency Department (HOSPITAL_BASED_OUTPATIENT_CLINIC_OR_DEPARTMENT_OTHER)
Admission: EM | Admit: 2021-11-22 | Discharge: 2021-11-22 | Disposition: A | Discharge status: Home / Self Care | Payer: Medicaid Other | Attending: Emergency Medicine | Admitting: Emergency Medicine

## 2021-11-22 ENCOUNTER — Other Ambulatory Visit: Payer: Self-pay

## 2021-11-22 ENCOUNTER — Emergency Department (HOSPITAL_BASED_OUTPATIENT_CLINIC_OR_DEPARTMENT_OTHER): Payer: Medicaid Other

## 2021-11-22 ENCOUNTER — Encounter (HOSPITAL_BASED_OUTPATIENT_CLINIC_OR_DEPARTMENT_OTHER): Payer: Self-pay | Admitting: Emergency Medicine

## 2021-11-22 DIAGNOSIS — O26892 Other specified pregnancy related conditions, second trimester: Secondary | ICD-10-CM | POA: Diagnosis present

## 2021-11-22 DIAGNOSIS — Z3A23 23 weeks gestation of pregnancy: Secondary | ICD-10-CM | POA: Diagnosis not present

## 2021-11-22 DIAGNOSIS — O99512 Diseases of the respiratory system complicating pregnancy, second trimester: Secondary | ICD-10-CM | POA: Diagnosis not present

## 2021-11-22 DIAGNOSIS — J40 Bronchitis, not specified as acute or chronic: Secondary | ICD-10-CM | POA: Insufficient documentation

## 2021-11-22 LAB — CBC WITH DIFFERENTIAL/PLATELET
Abs Immature Granulocytes: 0.06 10*3/uL (ref 0.00–0.07)
Basophils Absolute: 0 10*3/uL (ref 0.0–0.1)
Basophils Relative: 0 %
Eosinophils Absolute: 0.3 10*3/uL (ref 0.0–0.5)
Eosinophils Relative: 3 %
HCT: 32.5 % — ABNORMAL LOW (ref 36.0–46.0)
Hemoglobin: 11 g/dL — ABNORMAL LOW (ref 12.0–15.0)
Immature Granulocytes: 1 %
Lymphocytes Relative: 16 %
Lymphs Abs: 1.7 10*3/uL (ref 0.7–4.0)
MCH: 29.1 pg (ref 26.0–34.0)
MCHC: 33.8 g/dL (ref 30.0–36.0)
MCV: 86 fL (ref 80.0–100.0)
Monocytes Absolute: 0.5 10*3/uL (ref 0.1–1.0)
Monocytes Relative: 4 %
Neutro Abs: 8.3 10*3/uL — ABNORMAL HIGH (ref 1.7–7.7)
Neutrophils Relative %: 76 %
Platelets: 209 10*3/uL (ref 150–400)
RBC: 3.78 MIL/uL — ABNORMAL LOW (ref 3.87–5.11)
RDW: 13.5 % (ref 11.5–15.5)
WBC: 10.9 10*3/uL — ABNORMAL HIGH (ref 4.0–10.5)
nRBC: 0 % (ref 0.0–0.2)

## 2021-11-22 LAB — BASIC METABOLIC PANEL
Anion gap: 6 (ref 5–15)
BUN: 9 mg/dL (ref 6–20)
CO2: 22 mmol/L (ref 22–32)
Calcium: 8.4 mg/dL — ABNORMAL LOW (ref 8.9–10.3)
Chloride: 105 mmol/L (ref 98–111)
Creatinine, Ser: 0.51 mg/dL (ref 0.44–1.00)
GFR, Estimated: >60 mL/min (ref >60)
Glucose, Bld: 90 mg/dL (ref 70–99)
Potassium: 3.6 mmol/L (ref 3.5–5.1)
Sodium: 133 mmol/L — ABNORMAL LOW (ref 135–145)

## 2021-11-22 LAB — D-DIMER, QUANTITATIVE: D-Dimer, Quant: 0.49 ug/mL-FEU (ref 0.00–0.50)

## 2021-11-22 LAB — TROPONIN I (HIGH SENSITIVITY): Troponin I (High Sensitivity): <2 ng/L (ref <18)

## 2021-11-22 MED ORDER — AZITHROMYCIN 250 MG PO TABS: 250.0000 mg | ORAL_TABLET | Freq: Every day | ORAL | 0 refills | Status: AC

## 2021-11-22 MED ORDER — IPRATROPIUM-ALBUTEROL 0.5-2.5 (3) MG/3ML IN SOLN
3.0000 mL | Freq: Once | RESPIRATORY_TRACT | Status: AC
  Administered 2021-11-22: 3 mL via RESPIRATORY_TRACT
  Filled 2021-11-22: qty 3

## 2021-11-22 MED ORDER — ALBUTEROL SULFATE HFA 108 (90 BASE) MCG/ACT IN AERS
2.0000 | INHALATION_SPRAY | RESPIRATORY_TRACT | Status: DC | PRN
  Administered 2021-11-22: 2 via RESPIRATORY_TRACT
  Filled 2021-11-22: qty 6.7

## 2021-11-22 NOTE — ED Notes (Signed)
Called rapid ob nurse, said that we do not monitor for less than 24 weeks. Pt denies any problems with this pregnancy. Dr Wyvonnia Dusky notified.  Doppled fetal hr was 155 on the rt lower abdomen

## 2021-11-22 NOTE — ED Provider Notes (Signed)
[facility] Provider Note   CSN: [id number] Arrival date & time: 11/22/21  2101     History  Chief Complaint  Patient presents with   Cough   Shortness of Breath    Katherine Wall is a 26 y.o. female.  Patient with a 3-day history of URI symptoms occluding cough, sore throat, congestion, wheezing.  She was seen at urgent care yesterday and given a breathing treatment with partial success.  She reports however that made her palpitations worse which she has a history of.  She feels like she is having increased shortness of breath with a nonproductive cough, congestion and sore throat.  Denies fever.  Denies any abdominal pain or vaginal bleeding.  She is currently [redacted] weeks pregnant without any issues with this pregnancy.  Denies any bleeding or pelvic pain. Denies any history of asthma.  She was a former smoker.  Has had sick contacts at home.  She is concerned because she is having increased shortness of breath and more congestion and feeling like she is not improving. She saw cardiology today due to palpitations which is recurrent issue for her and is being scheduled for Holter monitor and echocardiogram.  She states this is unrelated to her current illness. She describes only having chest pain only fleetingly last for a few seconds at a time.  The history is provided by the patient.  Cough Associated symptoms: chest pain, rhinorrhea, shortness of breath and sore throat   Associated symptoms: no fever, no headaches and no myalgias   Shortness of Breath Associated symptoms: chest pain, cough and sore throat   Associated symptoms: no abdominal pain, no fever, no headaches and no vomiting       Home Medications Prior to Admission medications   Medication Sig Start Date End Date Taking? Authorizing Provider  cetirizine (ZYRTEC) 10 MG tablet Take by mouth. 09/22/20   [provider]  hydrOXYzine (VISTARIL) 50 MG capsule Take by mouth. 03/13/20    [provider]  PROAIR HFA 108 (90 Base) MCG/ACT inhaler Inhale into the lungs. 09/22/20   [provider]  sertraline (ZOLOFT) 50 MG tablet Take a half tablet daily for 1 week then 1 tablet daily 12/22/19   [provider]      Allergies    Patient has no known allergies.    Review of Systems   Review of Systems  Constitutional:  Negative for fever.  HENT:  Positive for congestion, rhinorrhea and sore throat.   Respiratory:  Positive for cough and shortness of breath.   Cardiovascular:  Positive for chest pain.  Gastrointestinal:  Negative for abdominal pain, nausea and vomiting.  Genitourinary:  Negative for hematuria.  Musculoskeletal:  Negative for arthralgias and myalgias.  Neurological:  Positive for weakness. Negative for dizziness and headaches.   all other systems are negative except as noted in the HPI and PMH.   Physical Exam Updated Vital Signs BP 127/73 (BP Location: Right Arm)   Pulse 89   Temp 98.2 F (36.8 C) (Oral)   Resp 19   Ht 5\' 9"  (1.753 m)   Wt 116.6 kg   LMP  (LMP Unknown)   SpO2 98%   BMI 37.95 kg/m  Physical Exam Vitals and nursing note reviewed.  Constitutional:      General: She is not in acute distress.    Appearance: She is well-developed.     Comments: Frequent moist cough  HENT:     Head: Normocephalic and atraumatic.  Mouth/Throat:     Pharynx: No oropharyngeal exudate.  Eyes:     Conjunctiva/sclera: Conjunctivae normal.     Pupils: Pupils are equal, round, and reactive to light.  Neck:     Comments: No meningismus. Cardiovascular:     Rate and Rhythm: Regular rhythm. Tachycardia present.     Heart sounds: Normal heart sounds. No murmur heard. Pulmonary:     Effort: Pulmonary effort is normal. No respiratory distress.     Breath sounds: Wheezing present.  Abdominal:     Palpations: Abdomen is soft.     Tenderness: There is no abdominal tenderness. There is no guarding or rebound.  Musculoskeletal:         General: No tenderness. Normal range of motion.     Cervical back: Normal range of motion and neck supple.  Skin:    General: Skin is warm.  Neurological:     Mental Status: She is alert and oriented to person, place, and time.     Cranial Nerves: No cranial nerve deficit.     Motor: No abnormal muscle tone.     Coordination: Coordination normal.     Comments:  5/5 strength throughout. CN 2-12 intact.Equal grip strength.   Psychiatric:        Behavior: Behavior normal.    ED Results / Procedures / Treatments   Labs (all labs ordered are listed, but only abnormal results are displayed) Labs Reviewed  CBC WITH DIFFERENTIAL/PLATELET - Abnormal; Notable for the following components:      Result Value   WBC 10.9 (*)    RBC 3.78 (*)    Hemoglobin 11.0 (*)    HCT 32.5 (*)    Neutro Abs 8.3 (*)    All other components within normal limits  BASIC METABOLIC PANEL - Abnormal; Notable for the following components:   Sodium 133 (*)    Calcium 8.4 (*)    All other components within normal limits  D-DIMER, QUANTITATIVE  TROPONIN I (HIGH SENSITIVITY)  TROPONIN I (HIGH SENSITIVITY)    EKG EKG Interpretation  Date/Time:  Friday Nov 22 2021 21:37:18 EDT Ventricular Rate:  89 PR Interval:  150 QRS Duration: 73 QT Interval:  322 QTC Calculation: 392 R Axis:   69 Text Interpretation: Sinus rhythm No significant change was found Confirmed by Ezequiel Essex [phone]) on 11/22/2021 11:37:58 PM  Radiology DG Chest 2 View  Result Date: 11/22/2021 CLINICAL DATA:  Shortness of breath. EXAM: CHEST - 2 VIEW COMPARISON:  09/22/20 FINDINGS: Heart size and mediastinal contours are unremarkable. Lungs are hypoinflated. No pleural effusion or edema. Mild scar versus platelike atelectasis in the left lower lobe. No airspace opacities. Visualized osseous structures are unremarkable. IMPRESSION: Low lung volumes with scar versus platelike atelectasis in the left lower lobe. Electronically Signed   By:  Kerby Moors M.D.   On: 11/22/2021 21:32    Procedures Procedures    Medications Ordered in ED Medications  albuterol (VENTOLIN HFA) 108 (90 Base) MCG/ACT inhaler 2 puff (has no administration in time range)  ipratropium-albuterol (DUONEB) 0.5-2.5 (3) MG/3ML nebulizer solution 3 mL (has no administration in time range)    ED Course/ Medical Decision Making/ A&P                           Medical Decision Making Amount and/or Complexity of Data Reviewed Labs: ordered. Decision-making details documented in ED Course. Radiology: ordered and independent interpretation performed. Decision-making details documented in ED Course.  ECG/medicine tests: ordered and independent interpretation performed. Decision-making details documented in ED Course.  Risk Prescription drug management.  [redacted] weeks pregnant here with a 3-day history of URI symptoms including cough, runny nose, sore throat.  No hypoxia was tachycardic on arrival.  EKG is sinus rhythm. RROB states no need for tocomonitoring before 24 weeks.  She is wheezing on exam will be given albuterol breathing treatment. Chest x-ray obtained in triage shows no pneumonia.  Results reviewed and interpreted by me  Patient with frequent moist cough.  COVID was negative yesterday.  Patient able to ambulate without desaturation.  Given chest x-ray findings we will treat for possible pneumonia.  Will try to avoid steroids given her pregnancy status.  Low suspicion for ACS or pulmonary embolism.  D-dimer is negative.  We will treat with over-the-counter Robitussin as well as antibiotics for likely bronchitis and possible early pneumonia.  Follow-up with PCP as well as her OB doctor.  Return precautions discussed.       Final Clinical Impression(s) / ED Diagnoses Final diagnoses:  Bronchitis    Rx / DC Orders ED Discharge Orders     None         Tylesha Gibeault, Annie Main, MD 11/22/21 2340

## 2021-11-22 NOTE — Discharge Instructions (Signed)
Use albuterol as needed every 4 hours.  Take the antibiotics as prescribed and follow-up with your doctor.  Return to the ED with difficulty breathing, chest pain, persistent fevers, or other concerns

## 2021-11-22 NOTE — ED Notes (Signed)
Pt was ambulated, Hr stayed at 109 and oxygen stayed at 98 %

## 2021-11-22 NOTE — ED Triage Notes (Signed)
Pt states has been sick for few days went to [facility] yesterday Flu/Covid negative. Was told come to ED if did not get better.

## 2023-06-23 IMAGING — DX DG CHEST 2V
2 series · 2 of 2 positions shown · non-contrast
Comparison: 09/22/20

CLINICAL DATA: Shortness of breath.

EXAM:
CHEST - 2 VIEW

[chest pa]
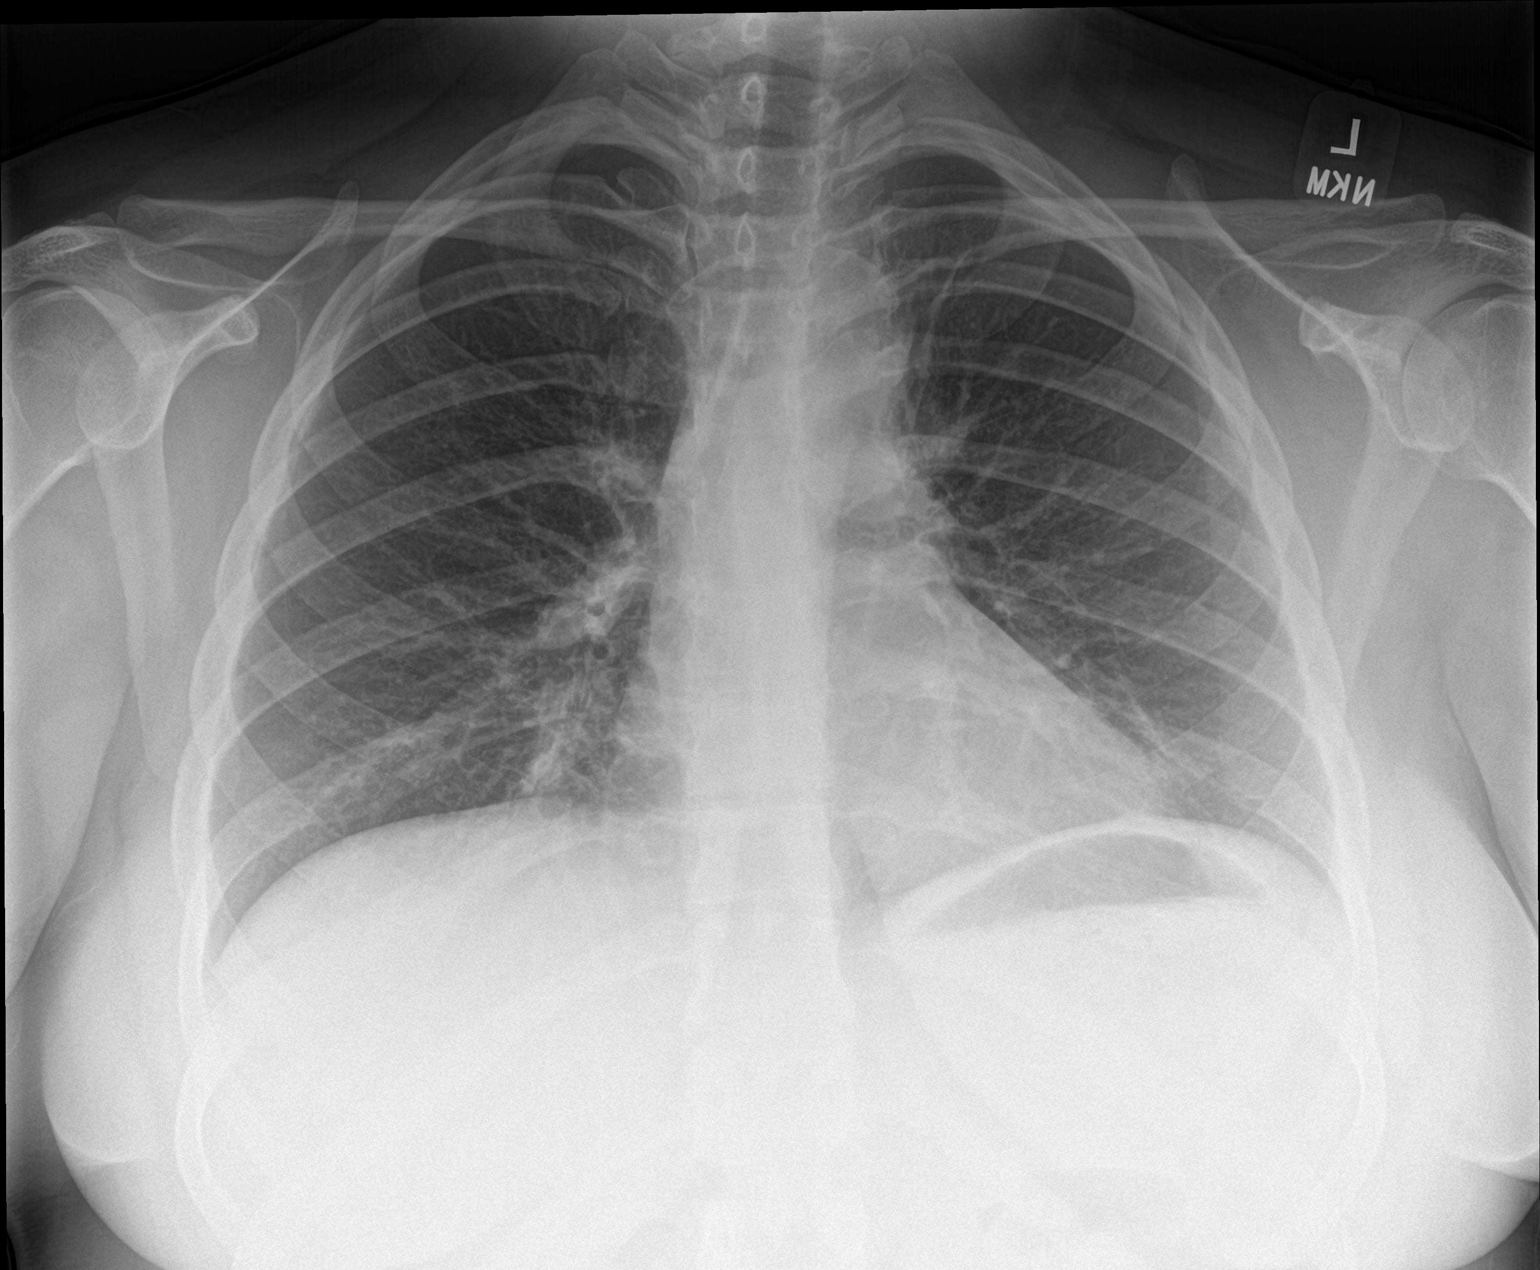

[chest lat]
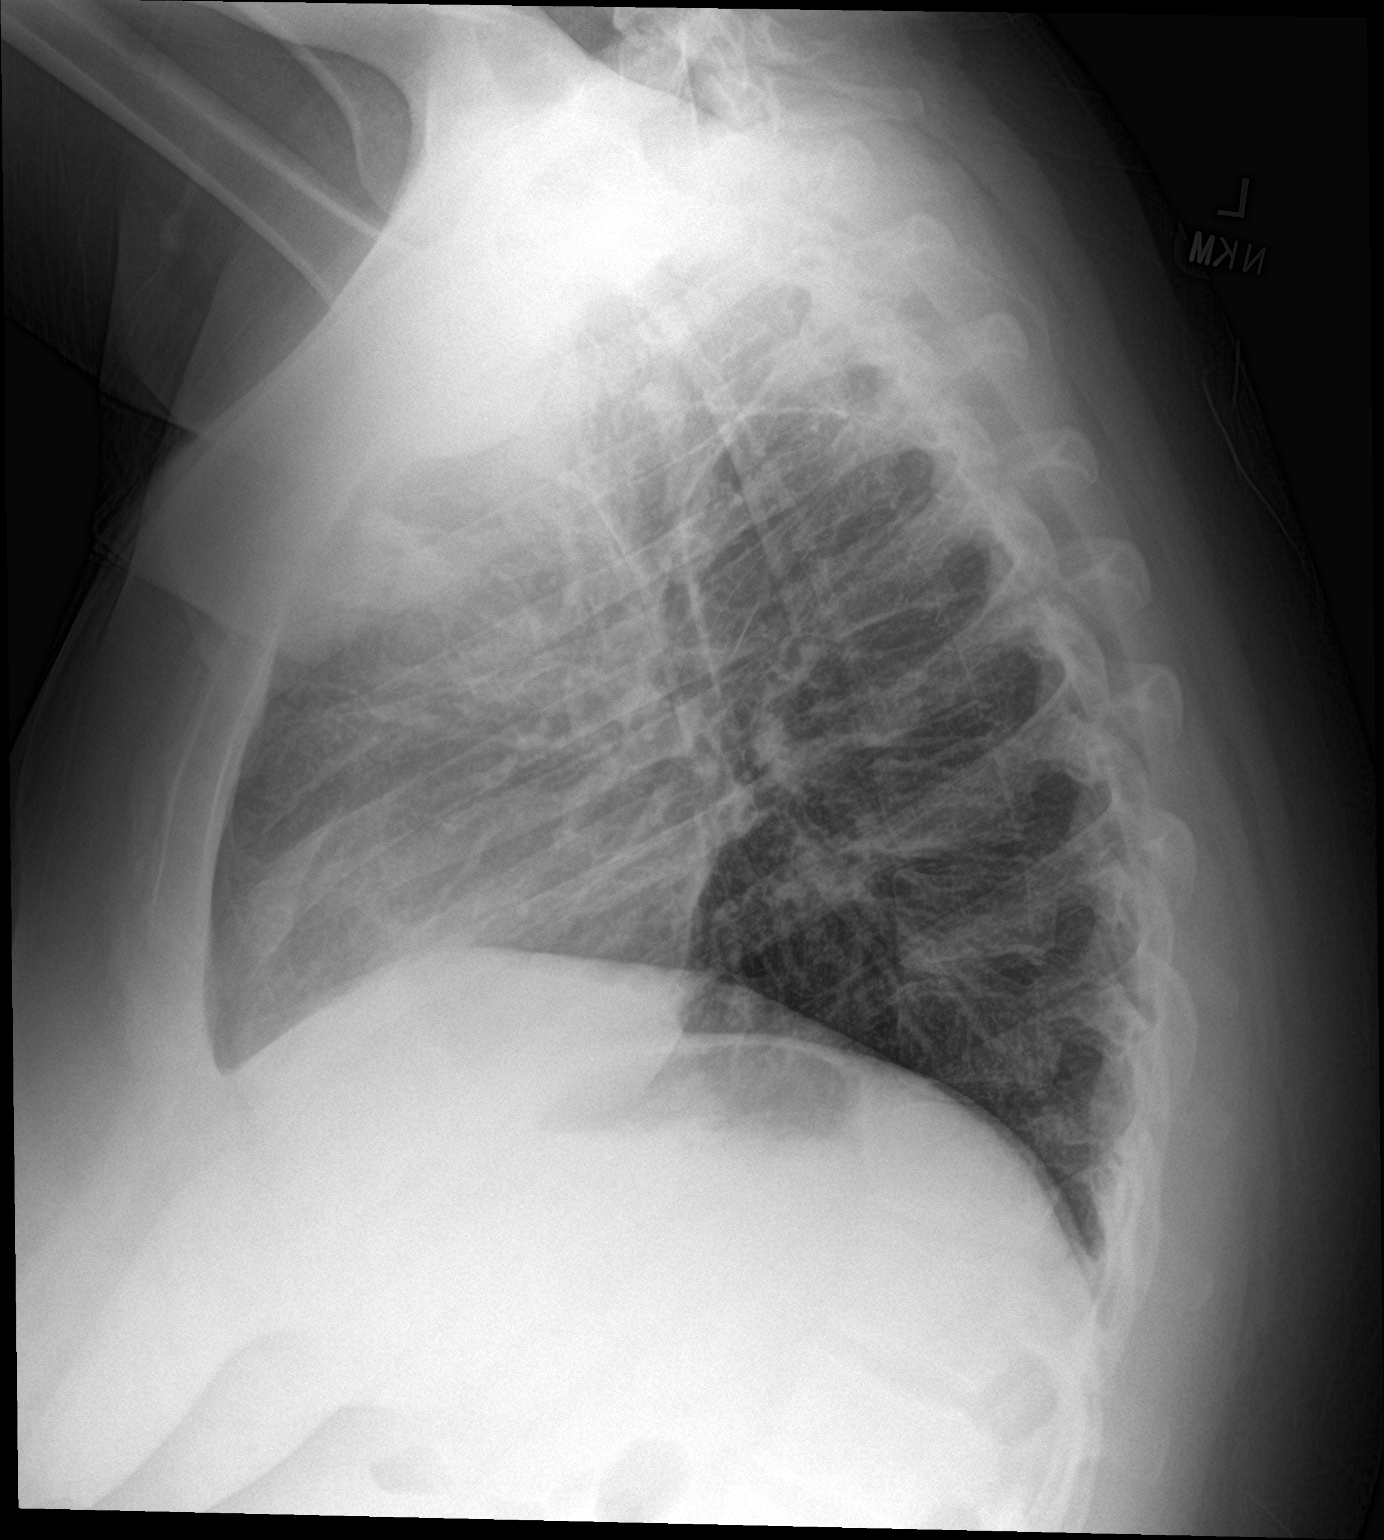

[2 of 2 positions shown; findings below may reference images not displayed]

FINDINGS: Heart size and mediastinal contours are unremarkable. Lungs are
hypoinflated. No pleural effusion or edema. Mild scar versus
platelike atelectasis in the left lower lobe. No airspace opacities.
Visualized osseous structures are unremarkable.
IMPRESSION: Low lung volumes with scar versus platelike atelectasis in the left
lower lobe.
